# Patient Record
Sex: Male | Born: 1985 | Race: Black or African American | Hispanic: No | Marital: Single | Smoking: Never smoker
Health system: Southern US, Community
[De-identification: ages and names within clinical notes are randomized; demographics above are authoritative.]

## PROBLEM LIST (undated history)

## (undated) ENCOUNTER — Emergency Department (HOSPITAL_COMMUNITY): Admission: EM | Payer: Medicare Other | Source: Home / Self Care

---

## 2022-02-05 ENCOUNTER — Emergency Department: Payer: Medicare Other

## 2022-02-05 ENCOUNTER — Other Ambulatory Visit: Payer: Self-pay

## 2022-02-05 ENCOUNTER — Emergency Department
Admission: EM | Admit: 2022-02-05 | Discharge: 2022-02-05 | Disposition: A | Discharge status: Home / Self Care | Payer: Medicare Other | Attending: Emergency Medicine | Admitting: Emergency Medicine

## 2022-02-05 DIAGNOSIS — R519 Headache, unspecified: Secondary | ICD-10-CM | POA: Diagnosis present

## 2022-02-05 DIAGNOSIS — G43809 Other migraine, not intractable, without status migrainosus: Secondary | ICD-10-CM | POA: Insufficient documentation

## 2022-02-05 MED ORDER — PROCHLORPERAZINE MALEATE 10 MG PO TABS
10.0000 mg | ORAL_TABLET | Freq: Once | ORAL | Status: AC
  Administered 2022-02-05: 10 mg via ORAL
  Filled 2022-02-05: qty 1

## 2022-02-05 MED ORDER — ACETAMINOPHEN 500 MG PO TABS
1000.0000 mg | ORAL_TABLET | Freq: Once | ORAL | Status: AC
  Administered 2022-02-05: 1000 mg via ORAL
  Filled 2022-02-05: qty 2

## 2022-02-05 NOTE — ED Notes (Signed)
Patient reports a history of headache, with no tingly or numbness or weakness. Pt has hx of TBI about 30 years ago.  ?

## 2022-02-05 NOTE — Discharge Instructions (Addendum)
-  Please establish with a primary care provider and follow-up as needed for ongoing management of recurrent headaches. ?-You may take Tylenol/ibuprofen as needed if headache returns. ?-Return to the emergency department anytime if you begin to experience any new or worsening symptoms. ?

## 2022-02-05 NOTE — ED Triage Notes (Signed)
Headache around right temporal area since last night. No meds prior to arrival.  ?

## 2022-02-05 NOTE — ED Provider Notes (Signed)
? ?[facility] ?Provider Note ? ? ? Event Date/Time  ? First MD Initiated Contact with Patient 02/05/22 [phone]   ?  (approximate) ? ? ?History  ? ?Chief Complaint ?Headache ? ? ?HPI ?Marcus Noble is a 36 y.o. male, no remarkable medical history, presents the emergency department for evaluation of headache.  Patient states that his headache started last night has been hurting intermittently.  He states that the pain is mostly along the right side of his head.  Endorses photophobia as well.  Denies fever/chills, neck pain/stiffness, chest pain, shortness of breath, hearing changes, abdominal pain, nausea/vomiting, diarrhea, urinary symptoms, or numbness/tingling in upper or lower extremities ? ?History Limitations: No limitations. ? ?    ? ? ?Physical Exam  ?Triage Vital Signs: ?ED Triage Vitals  ?Enc Vitals Group  ?   BP 02/05/22 0623 113/85  ?   Pulse Rate 02/05/22 0623 81  ?   Resp 02/05/22 0623 18  ?   Temp 02/05/22 0623 98.4 ?F (36.9 ?C)  ?   Temp src --   ?   SpO2 02/05/22 0623 96 %  ?   Weight 02/05/22 0624 150 lb (68 kg)  ?   Height 02/05/22 0624 5\' 8"  (1.727 m)  ?   Head Circumference --   ?   Peak Flow --   ?   Pain Score 02/05/22 0623 4  ?   Pain Loc --   ?   Pain Edu? --   ?   Excl. in GC? --   ? ? ?Most recent vital signs: ?Vitals:  ? 02/05/22 0623 02/05/22 0751  ?BP: 113/85 127/84  ?Pulse: 81 72  ?Resp: 18 18  ?Temp: 98.4 ?F (36.9 ?C) 97.7 ?F (36.5 ?C)  ?SpO2: 96% 98%  ? ? ?General: Awake, appears uncomfortable. ?Skin: Warm, dry. No rashes or lesions.  ?Eyes: PERRL. Conjunctivae normal.  ?CV: Good peripheral perfusion.  ?Resp: Normal effort.  ?Abd: Soft, non-tender. No distention.  ?Neuro: At baseline. No gross neurological deficits.  ? ?Focused Exam: Photophobia with penlight examination.  No erythema or ecchymosis along the right side of the head.  No tenderness.  No neck stiffness or nuchal rigidity.  Patient is able to ambulate on his own without assistance. ? ?Physical  Exam ? ? ? ?ED Results / Procedures / Treatments  ?Labs ?(all labs ordered are listed, but only abnormal results are displayed) ?Labs Reviewed - No data to display ? ? ?EKG ?Not applicable. ? ? ?RADIOLOGY ? ?ED Provider Interpretation: I personally viewed the CT scan, no evidence of acute abnormalities based on my interpretation ? ?CT Head Wo Contrast ? ?Result Date: 02/05/2022 ?CLINICAL DATA:  Severe sudden headaches. EXAM: CT HEAD WITHOUT CONTRAST TECHNIQUE: Contiguous axial images were obtained from the base of the skull through the vertex without intravenous contrast. RADIATION DOSE REDUCTION: This exam was performed according to the departmental dose-optimization program which includes automated exposure control, adjustment of the mA and/or kV according to patient size and/or use of iterative reconstruction technique. COMPARISON:  None. FINDINGS: Brain: No evidence of acute infarction, hemorrhage, hydrocephalus, extra-axial collection or mass lesion/mass effect. Vascular: No hyperdense vessel is noted. Skull: Normal. Negative for fracture or focal lesion. Sinuses/Orbits: Mild mucoperiosteal thickening bilateral maxillary sinus, left ethmoid sinus, sphenoid sinus identified. Orbits are normal. Other: None. IMPRESSION: 1. No acute intracranial abnormality identified. 2. Mild mucoperiosteal thickening bilateral maxillary sinus, left ethmoid sinus, sphenoid sinus. Electronically Signed   By: 02/07/2022.D.  On: 02/05/2022 08:12   ? ?PROCEDURES: ? ?Critical Care performed: Not applicable. ? ?Procedures ? ? ? ?MEDICATIONS ORDERED IN ED: ?Medications  ?acetaminophen (TYLENOL) tablet 1,000 mg (1,000 mg Oral Given 02/05/22 0756)  ?prochlorperazine (COMPAZINE) tablet 10 mg (10 mg Oral Given 02/05/22 0827)  ? ? ? ?IMPRESSION / MDM / ASSESSMENT AND PLAN / ED COURSE  ?I reviewed the triage vital signs and the nursing notes. ?             ?               ? ?Differential diagnosis includes, but is not limited to, tension  headache, migraine headache, temporal arteritis, epidural/subdural hematoma, subarachnoid hemorrhage. ? ?ED Course ?Patient appears uncomfortable, but stable.  Vitals within normal limits.  We will go ahead and treat with acetaminophen and Compazine.  Patient specifically requesting no IV or IM shots. ? ?Assessment/Plan ?Presentation consistent with migraine headache.  Head CT is reassuring for no evidence of CNS lesion or subarachnoid hemorrhage.  History and physical exam not concerning for meningitis.  Patient has responded well to the p.o. medications provided.  He states that he is ready to go home.  We will plan to discharge.  Encouraged him to follow-up with his primary care provider as needed for recurring headache/migraine management. ? ?Considered admission for this patient, but given his stable presentation and improvement in symptoms, he is unlikely to benefit. ? ?Provided the patient with anticipatory guidance, return precautions, and educational material. Encouraged the patient to return to the emergency department at any time if they begin to experience any new or worsening symptoms. Patient expressed understanding and agreed with the plan.  ? ?  ? ? ?FINAL CLINICAL IMPRESSION(S) / ED DIAGNOSES  ? ?Final diagnoses:  ?Other migraine without status migrainosus, not intractable  ? ? ? ?Rx / DC Orders  ? ?ED Discharge Orders   ? ? None  ? ?  ? ? ? ?Note:  This document was prepared using Dragon voice recognition software and may include unintentional dictation errors. ?  ?Varney Daily, [place] ?02/05/22 0840 ? ?  ?Arnaldo Natal, MD ?02/05/22 1611 ? ?

## 2022-12-30 IMAGING — CT CT HEAD W/O CM
4 series · 17 of 47 positions shown, 19 images · non-contrast
Comparison: None.

CLINICAL DATA: Severe sudden headaches.



[Series 2: head wo · axial · 0.39mm/px · z∈[-68,+52]mm · 7 of 33 slices shown, 9 images]
[im 5/33  brain]
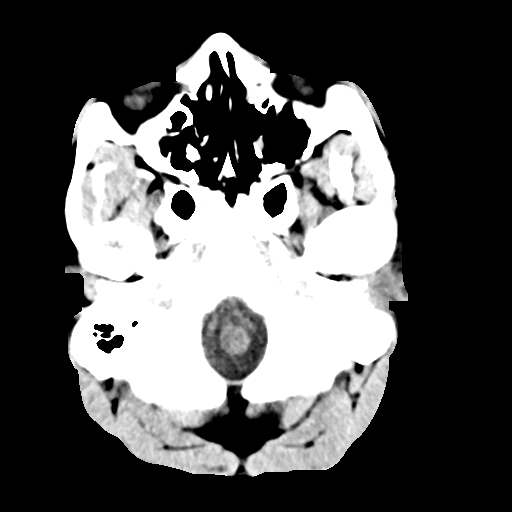
[im 5/33  bone]
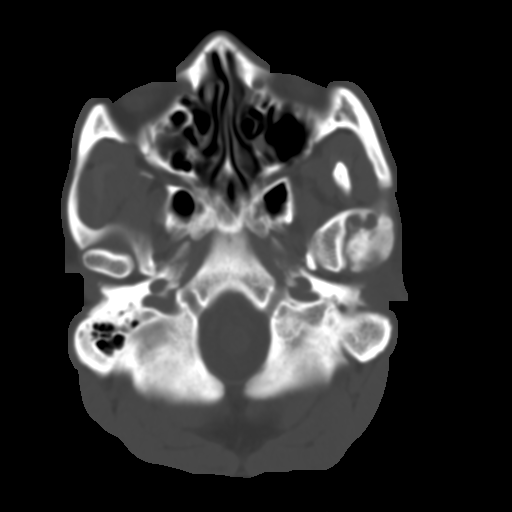
[im 9/33  brain]
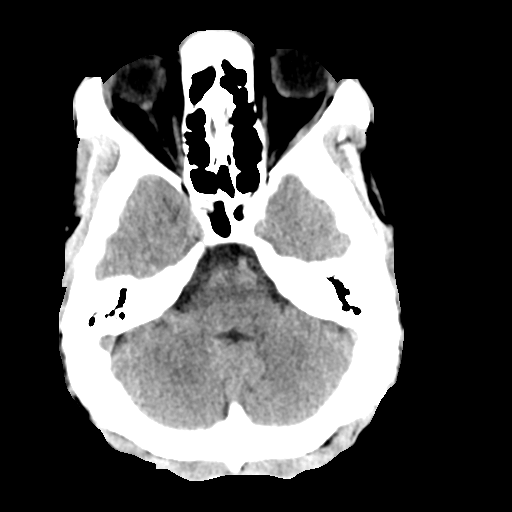
[im 13/33  brain]
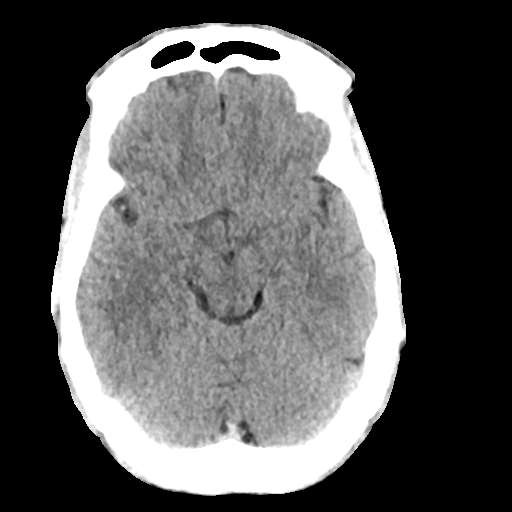
[im 17/33  brain]
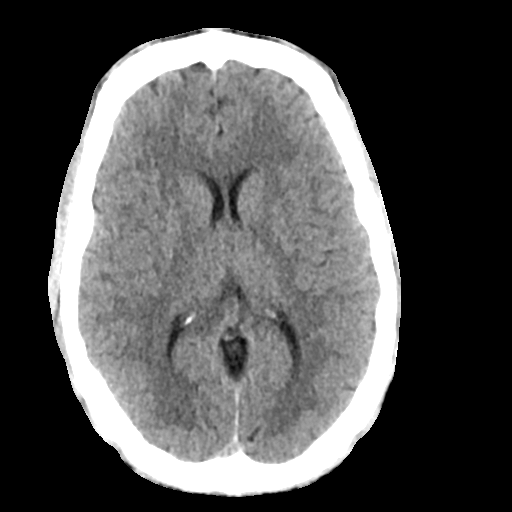
[im 21/33  brain]
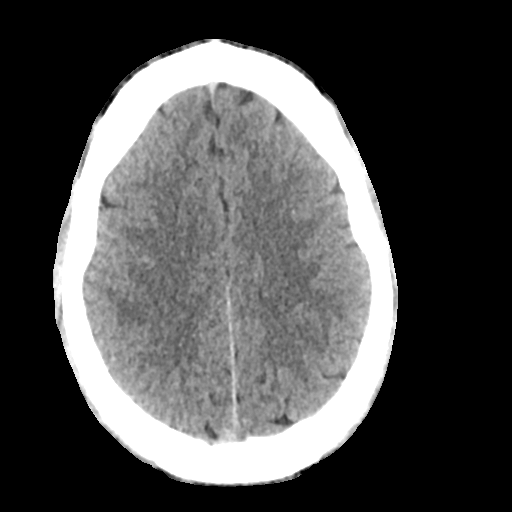
[im 21/33  bone]
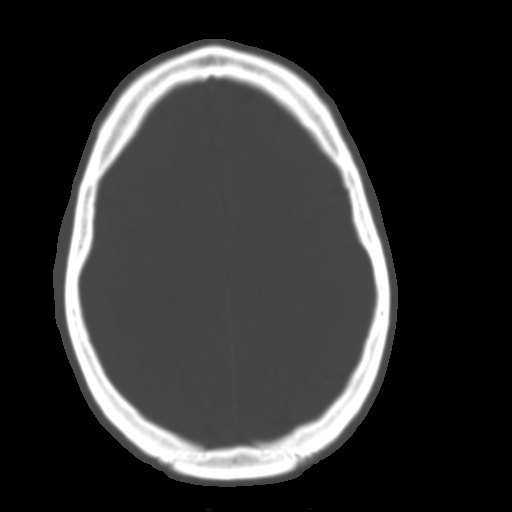
[im 25/33  brain]
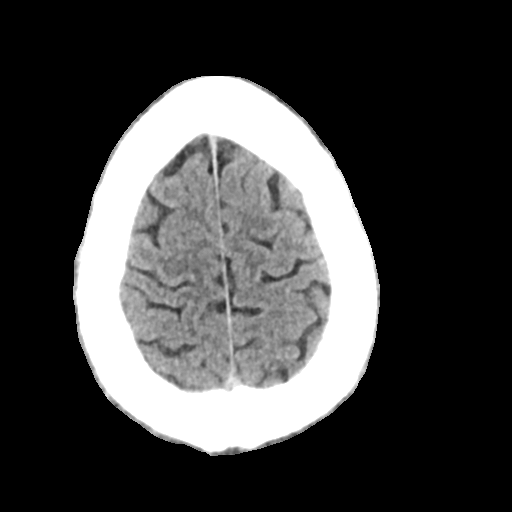
[im 29/33  brain]
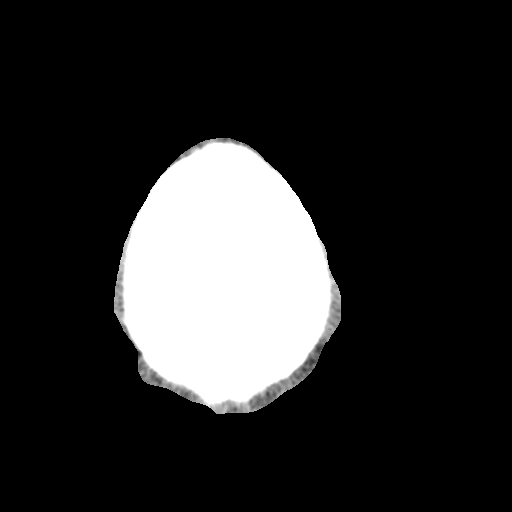

[Series 3: head bone · axial · 0.39mm/px · z∈[-72,-16]mm · 4 of 81 slices shown]
[im 9/81  bone]
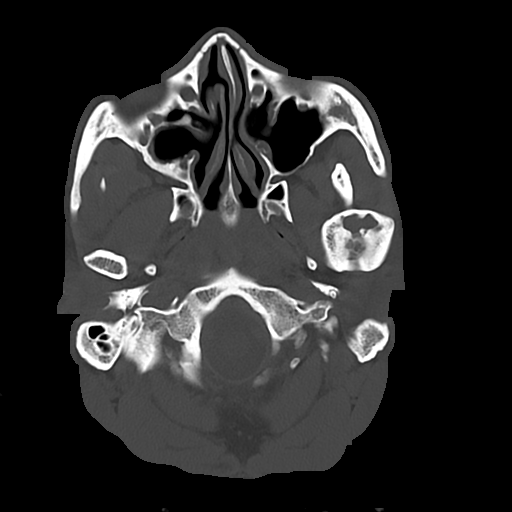
[im 17/81  bone]
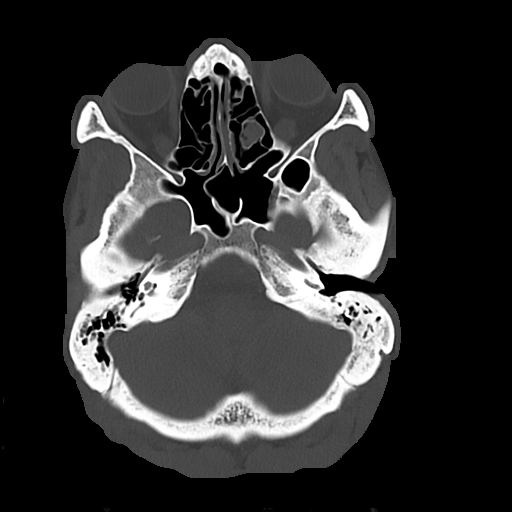
[im 25/81  bone]
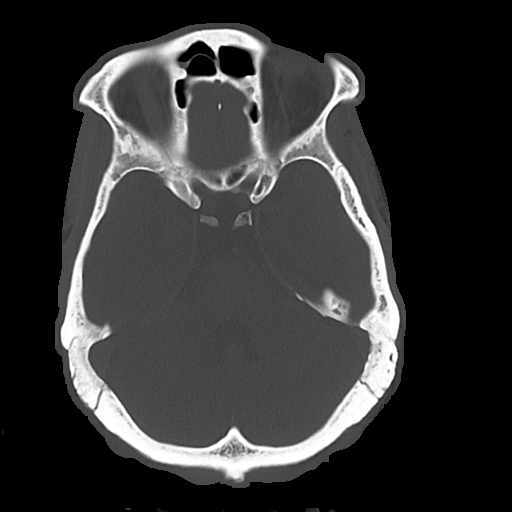
[im 37/81  bone]
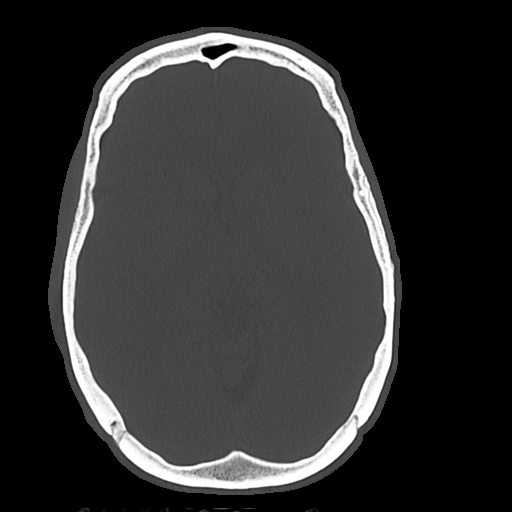

[Series 4: cor soft · coronal · 0.37mm/px · 3 of 67 slices shown]
[im 23/67  brain]
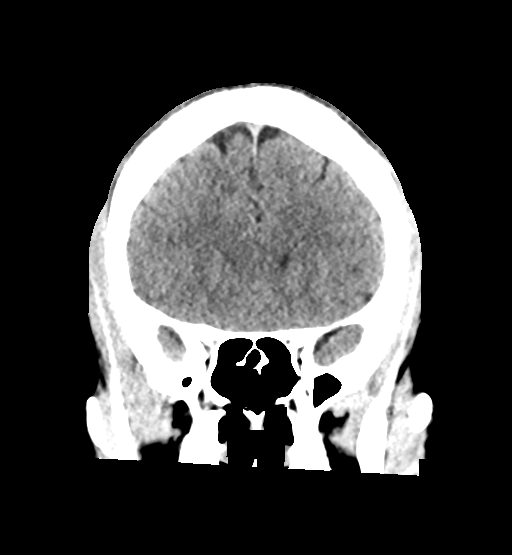
[im 30/67  brain]
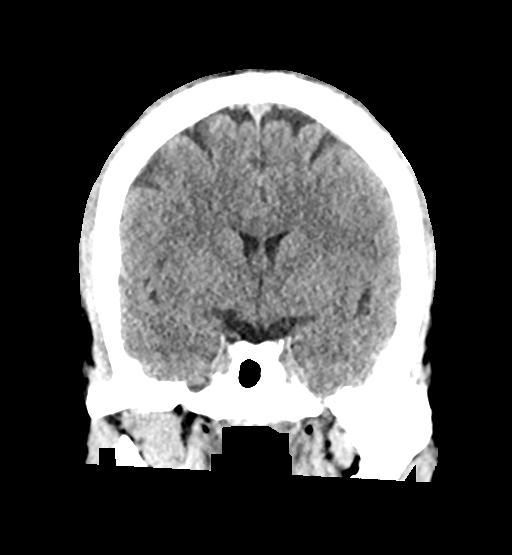
[im 37/67  brain]
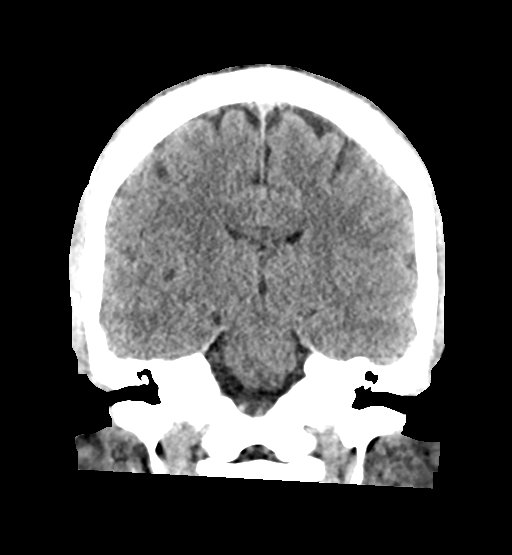

[Series 5: sag soft · sagittal · 0.40mm/px · 3 of 58 slices shown]
[im 20/58  brain]
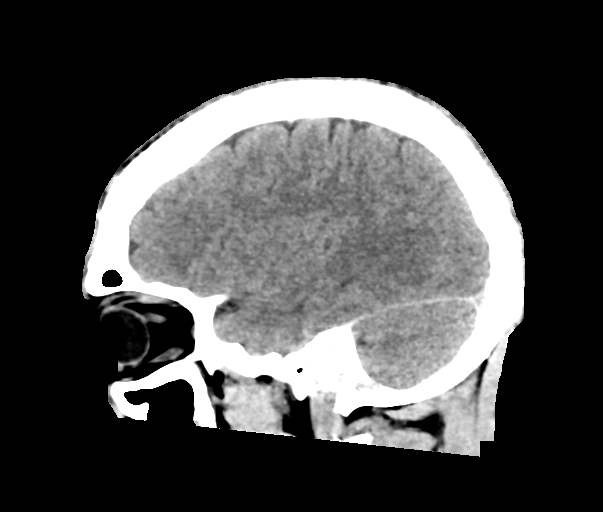
[im 29/58  brain]
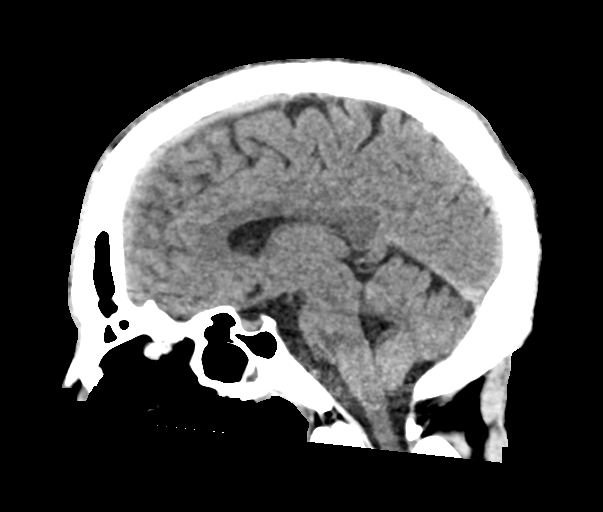
[im 39/58  brain]
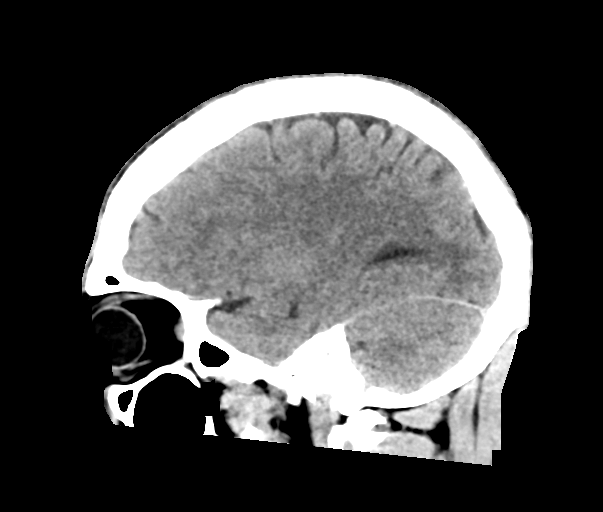

[17 of 47 positions shown; findings below may reference images not displayed]

FINDINGS: Brain: No evidence of acute infarction, hemorrhage, hydrocephalus,
extra-axial collection or mass lesion/mass effect.

Vascular: No hyperdense vessel is noted.

Skull: Normal. Negative for fracture or focal lesion.

Sinuses/Orbits: Mild mucoperiosteal thickening bilateral maxillary
sinus, left ethmoid sinus, sphenoid sinus identified. Orbits are
normal.

Other: None.
IMPRESSION: 1. No acute intracranial abnormality identified.
2. Mild mucoperiosteal thickening bilateral maxillary sinus, left
ethmoid sinus, sphenoid sinus.
